# Patient Record
Sex: Female | Born: 1955 | Race: Black or African American | Hispanic: No | Marital: Single | Smoking: Current every day smoker
Health system: Southern US, Community
[De-identification: ages and names within clinical notes are randomized; demographics above are authoritative.]

## PROBLEM LIST (undated history)

## (undated) DIAGNOSIS — C801 Malignant (primary) neoplasm, unspecified: Secondary | ICD-10-CM

## (undated) DIAGNOSIS — K56609 Unspecified intestinal obstruction, unspecified as to partial versus complete obstruction: Secondary | ICD-10-CM

## (undated) DIAGNOSIS — I639 Cerebral infarction, unspecified: Secondary | ICD-10-CM

## (undated) DIAGNOSIS — J219 Acute bronchiolitis, unspecified: Secondary | ICD-10-CM

## (undated) DIAGNOSIS — E119 Type 2 diabetes mellitus without complications: Secondary | ICD-10-CM

## (undated) DIAGNOSIS — K760 Fatty (change of) liver, not elsewhere classified: Secondary | ICD-10-CM

## (undated) HISTORY — PX: COLON SURGERY: SHX602

## (undated) HISTORY — PX: ABDOMINAL HYSTERECTOMY: SHX81

---

## 2017-07-22 ENCOUNTER — Encounter (HOSPITAL_COMMUNITY): Payer: Self-pay

## 2017-07-22 DIAGNOSIS — K56600 Partial intestinal obstruction, unspecified as to cause: Principal | ICD-10-CM | POA: Insufficient documentation

## 2017-07-22 DIAGNOSIS — Z923 Personal history of irradiation: Secondary | ICD-10-CM | POA: Diagnosis not present

## 2017-07-22 DIAGNOSIS — K435 Parastomal hernia without obstruction or  gangrene: Secondary | ICD-10-CM | POA: Diagnosis not present

## 2017-07-22 DIAGNOSIS — Z9221 Personal history of antineoplastic chemotherapy: Secondary | ICD-10-CM | POA: Diagnosis not present

## 2017-07-22 DIAGNOSIS — K56609 Unspecified intestinal obstruction, unspecified as to partial versus complete obstruction: Secondary | ICD-10-CM | POA: Diagnosis present

## 2017-07-22 DIAGNOSIS — Z7982 Long term (current) use of aspirin: Secondary | ICD-10-CM | POA: Diagnosis not present

## 2017-07-22 DIAGNOSIS — Z794 Long term (current) use of insulin: Secondary | ICD-10-CM | POA: Diagnosis not present

## 2017-07-22 DIAGNOSIS — Z9049 Acquired absence of other specified parts of digestive tract: Secondary | ICD-10-CM | POA: Diagnosis not present

## 2017-07-22 DIAGNOSIS — Z85038 Personal history of other malignant neoplasm of large intestine: Secondary | ICD-10-CM | POA: Diagnosis not present

## 2017-07-22 DIAGNOSIS — G8929 Other chronic pain: Secondary | ICD-10-CM | POA: Diagnosis not present

## 2017-07-22 DIAGNOSIS — Z933 Colostomy status: Secondary | ICD-10-CM | POA: Diagnosis not present

## 2017-07-22 DIAGNOSIS — F17210 Nicotine dependence, cigarettes, uncomplicated: Secondary | ICD-10-CM | POA: Diagnosis not present

## 2017-07-22 DIAGNOSIS — R109 Unspecified abdominal pain: Secondary | ICD-10-CM | POA: Diagnosis not present

## 2017-07-22 DIAGNOSIS — Z8673 Personal history of transient ischemic attack (TIA), and cerebral infarction without residual deficits: Secondary | ICD-10-CM | POA: Insufficient documentation

## 2017-07-22 DIAGNOSIS — J219 Acute bronchiolitis, unspecified: Secondary | ICD-10-CM | POA: Diagnosis not present

## 2017-07-22 DIAGNOSIS — K760 Fatty (change of) liver, not elsewhere classified: Secondary | ICD-10-CM | POA: Insufficient documentation

## 2017-07-22 DIAGNOSIS — Z79899 Other long term (current) drug therapy: Secondary | ICD-10-CM | POA: Diagnosis not present

## 2017-07-22 DIAGNOSIS — I10 Essential (primary) hypertension: Secondary | ICD-10-CM | POA: Insufficient documentation

## 2017-07-22 DIAGNOSIS — E119 Type 2 diabetes mellitus without complications: Secondary | ICD-10-CM | POA: Diagnosis not present

## 2017-07-22 LAB — COMPREHENSIVE METABOLIC PANEL
ALBUMIN: 3.2 g/dL — AB (ref 3.5–5.0)
ALK PHOS: 125 U/L (ref 38–126)
ALT: 9 U/L — AB (ref 14–54)
AST: 17 U/L (ref 15–41)
Anion gap: 7 (ref 5–15)
BUN: 7 mg/dL (ref 6–20)
CALCIUM: 8.8 mg/dL — AB (ref 8.9–10.3)
CHLORIDE: 106 mmol/L (ref 101–111)
CO2: 25 mmol/L (ref 22–32)
CREATININE: 0.64 mg/dL (ref 0.44–1.00)
GFR calc Af Amer: >60 mL/min (ref >60)
GFR calc non Af Amer: >60 mL/min (ref >60)
GLUCOSE: 238 mg/dL — AB (ref 65–99)
Potassium: 3.9 mmol/L (ref 3.5–5.1)
SODIUM: 138 mmol/L (ref 135–145)
Total Bilirubin: 0.4 mg/dL (ref 0.3–1.2)
Total Protein: 6.8 g/dL (ref 6.5–8.1)

## 2017-07-22 LAB — URINALYSIS, ROUTINE W REFLEX MICROSCOPIC
Bacteria, UA: NONE SEEN
Bilirubin Urine: NEGATIVE
GLUCOSE, UA: NEGATIVE mg/dL
Hgb urine dipstick: NEGATIVE
KETONES UR: NEGATIVE mg/dL
Leukocytes, UA: NEGATIVE
Nitrite: NEGATIVE
PH: 5 (ref 5.0–8.0)
PROTEIN: 30 mg/dL — AB
SQUAMOUS EPITHELIAL / LPF: NONE SEEN
Specific Gravity, Urine: 1.023 (ref 1.005–1.030)

## 2017-07-22 LAB — CBC
HCT: 36.3 % (ref 36.0–46.0)
Hemoglobin: 11.3 g/dL — ABNORMAL LOW (ref 12.0–15.0)
MCH: 27 pg (ref 26.0–34.0)
MCHC: 31.1 g/dL (ref 30.0–36.0)
MCV: 86.8 fL (ref 78.0–100.0)
PLATELETS: 279 10*3/uL (ref 150–400)
RBC: 4.18 MIL/uL (ref 3.87–5.11)
RDW: 16.4 % — ABNORMAL HIGH (ref 11.5–15.5)
WBC: 8.3 10*3/uL (ref 4.0–10.5)

## 2017-07-22 LAB — LIPASE, BLOOD: LIPASE: 24 U/L (ref 11–51)

## 2017-07-22 NOTE — ED Triage Notes (Signed)
Pt here for abd cramping, hx of colon cancer and reports pain is typical of her cancer pain, denies vomiting or diarrhea but does have nausea. Pt reports she is in remission

## 2017-07-23 ENCOUNTER — Inpatient Hospital Stay (HOSPITAL_COMMUNITY): Payer: Medicaid Other

## 2017-07-23 ENCOUNTER — Emergency Department (HOSPITAL_COMMUNITY): Payer: Medicaid Other

## 2017-07-23 ENCOUNTER — Encounter (HOSPITAL_COMMUNITY): Payer: Self-pay | Admitting: Internal Medicine

## 2017-07-23 ENCOUNTER — Observation Stay (HOSPITAL_COMMUNITY)
Admission: EM | Admit: 2017-07-23 | Discharge: 2017-07-24 | Disposition: A | Discharge status: Home / Self Care | Payer: Medicaid Other | Attending: Internal Medicine | Admitting: Internal Medicine

## 2017-07-23 DIAGNOSIS — Z0189 Encounter for other specified special examinations: Secondary | ICD-10-CM

## 2017-07-23 DIAGNOSIS — K56609 Unspecified intestinal obstruction, unspecified as to partial versus complete obstruction: Secondary | ICD-10-CM | POA: Diagnosis not present

## 2017-07-23 DIAGNOSIS — K760 Fatty (change of) liver, not elsewhere classified: Secondary | ICD-10-CM

## 2017-07-23 DIAGNOSIS — C801 Malignant (primary) neoplasm, unspecified: Secondary | ICD-10-CM | POA: Diagnosis not present

## 2017-07-23 DIAGNOSIS — K56600 Partial intestinal obstruction, unspecified as to cause: Secondary | ICD-10-CM | POA: Diagnosis present

## 2017-07-23 DIAGNOSIS — I10 Essential (primary) hypertension: Secondary | ICD-10-CM | POA: Diagnosis not present

## 2017-07-23 DIAGNOSIS — G8929 Other chronic pain: Secondary | ICD-10-CM | POA: Diagnosis not present

## 2017-07-23 DIAGNOSIS — J219 Acute bronchiolitis, unspecified: Secondary | ICD-10-CM | POA: Diagnosis not present

## 2017-07-23 DIAGNOSIS — I639 Cerebral infarction, unspecified: Secondary | ICD-10-CM

## 2017-07-23 DIAGNOSIS — E119 Type 2 diabetes mellitus without complications: Secondary | ICD-10-CM | POA: Diagnosis not present

## 2017-07-23 DIAGNOSIS — R109 Unspecified abdominal pain: Secondary | ICD-10-CM | POA: Diagnosis not present

## 2017-07-23 DIAGNOSIS — Z433 Encounter for attention to colostomy: Secondary | ICD-10-CM

## 2017-07-23 HISTORY — DX: Unspecified intestinal obstruction, unspecified as to partial versus complete obstruction: K56.609

## 2017-07-23 HISTORY — DX: Type 2 diabetes mellitus without complications: E11.9

## 2017-07-23 HISTORY — DX: Cerebral infarction, unspecified: I63.9

## 2017-07-23 HISTORY — DX: Malignant (primary) neoplasm, unspecified: C80.1

## 2017-07-23 HISTORY — DX: Fatty (change of) liver, not elsewhere classified: K76.0

## 2017-07-23 HISTORY — DX: Acute bronchiolitis, unspecified: J21.9

## 2017-07-23 LAB — TYPE AND SCREEN
ABO/RH(D): B POS
Antibody Screen: NEGATIVE

## 2017-07-23 LAB — HIV ANTIBODY (ROUTINE TESTING W REFLEX): HIV Screen 4th Generation wRfx: NONREACTIVE

## 2017-07-23 LAB — GLUCOSE, CAPILLARY
GLUCOSE-CAPILLARY: 202 mg/dL — AB (ref 65–99)
GLUCOSE-CAPILLARY: 115 mg/dL — AB (ref 65–99)
Glucose-Capillary: 137 mg/dL — ABNORMAL HIGH (ref 65–99)
Glucose-Capillary: 151 mg/dL — ABNORMAL HIGH (ref 65–99)

## 2017-07-23 LAB — CBG MONITORING, ED: Glucose-Capillary: 203 mg/dL — ABNORMAL HIGH (ref 65–99)

## 2017-07-23 LAB — ABO/RH: ABO/RH(D): B POS

## 2017-07-23 LAB — PROTIME-INR
INR: 1.07
PROTHROMBIN TIME: 13.9 s (ref 11.4–15.2)

## 2017-07-23 MED ORDER — SODIUM CHLORIDE 0.9 % IV SOLN
INTRAVENOUS | Status: AC
  Administered 2017-07-23: 07:00:00 via INTRAVENOUS

## 2017-07-23 MED ORDER — ACETAMINOPHEN 325 MG PO TABS: 650.0000 mg | ORAL_TABLET | Freq: Four times a day (QID) | ORAL | Status: DC | PRN

## 2017-07-23 MED ORDER — INSULIN ASPART 100 UNIT/ML ~~LOC~~ SOLN
0.0000 [IU] | SUBCUTANEOUS | Status: DC
  Administered 2017-07-23 (×2): 5 [IU] via SUBCUTANEOUS
  Administered 2017-07-23 – 2017-07-24 (×2): 3 [IU] via SUBCUTANEOUS
  Administered 2017-07-24: 2 [IU] via SUBCUTANEOUS
  Administered 2017-07-24: 5 [IU] via SUBCUTANEOUS
  Administered 2017-07-24: 3 [IU] via SUBCUTANEOUS
  Filled 2017-07-23: qty 1

## 2017-07-23 MED ORDER — ONDANSETRON HCL 4 MG/2ML IJ SOLN: 4.0000 mg | Freq: Three times a day (TID) | INTRAMUSCULAR | Status: DC | PRN

## 2017-07-23 MED ORDER — HYDROMORPHONE HCL 1 MG/ML IJ SOLN
1.0000 mg | Freq: Once | INTRAMUSCULAR | Status: AC
  Administered 2017-07-23: 1 mg via INTRAVENOUS
  Filled 2017-07-23: qty 1

## 2017-07-23 MED ORDER — INSULIN ASPART 100 UNIT/ML ~~LOC~~ SOLN: 0.0000 [IU] | Freq: Three times a day (TID) | SUBCUTANEOUS | Status: DC

## 2017-07-23 MED ORDER — ONDANSETRON HCL 4 MG/2ML IJ SOLN: 4.0000 mg | Freq: Four times a day (QID) | INTRAMUSCULAR | Status: DC | PRN

## 2017-07-23 MED ORDER — ALBUTEROL SULFATE (2.5 MG/3ML) 0.083% IN NEBU: 3.0000 mL | INHALATION_SOLUTION | Freq: Four times a day (QID) | RESPIRATORY_TRACT | Status: DC | PRN

## 2017-07-23 MED ORDER — IOPAMIDOL (ISOVUE-300) INJECTION 61%
INTRAVENOUS | Status: AC
  Filled 2017-07-23: qty 30

## 2017-07-23 MED ORDER — ONDANSETRON HCL 4 MG PO TABS: 4.0000 mg | ORAL_TABLET | Freq: Four times a day (QID) | ORAL | Status: DC | PRN

## 2017-07-23 MED ORDER — INSULIN GLARGINE 100 UNIT/ML ~~LOC~~ SOLN
10.0000 [IU] | Freq: Every day | SUBCUTANEOUS | Status: DC
  Administered 2017-07-23 – 2017-07-24 (×2): 10 [IU] via SUBCUTANEOUS
  Filled 2017-07-23 (×2): qty 0.1

## 2017-07-23 MED ORDER — KETOROLAC TROMETHAMINE 15 MG/ML IJ SOLN
15.0000 mg | Freq: Four times a day (QID) | INTRAMUSCULAR | Status: DC | PRN
  Administered 2017-07-23: 15 mg via INTRAVENOUS
  Filled 2017-07-23: qty 1

## 2017-07-23 MED ORDER — HYDRALAZINE HCL 20 MG/ML IJ SOLN: 5.0000 mg | INTRAMUSCULAR | Status: DC | PRN

## 2017-07-23 MED ORDER — ACETAMINOPHEN 650 MG RE SUPP: 650.0000 mg | Freq: Four times a day (QID) | RECTAL | Status: DC | PRN

## 2017-07-23 MED ORDER — FENTANYL CITRATE (PF) 100 MCG/2ML IJ SOLN
50.0000 ug | Freq: Once | INTRAMUSCULAR | Status: AC
  Administered 2017-07-23: 50 ug via INTRAVENOUS
  Filled 2017-07-23: qty 2

## 2017-07-23 MED ORDER — DIATRIZOATE MEGLUMINE & SODIUM 66-10 % PO SOLN
90.0000 mL | Freq: Once | ORAL | Status: AC
  Administered 2017-07-23: 90 mL via NASOGASTRIC
  Filled 2017-07-23 (×2): qty 90

## 2017-07-23 MED ORDER — MORPHINE SULFATE (PF) 4 MG/ML IV SOLN
2.0000 mg | INTRAVENOUS | Status: DC | PRN
  Administered 2017-07-23: 2 mg via INTRAVENOUS
  Filled 2017-07-23: qty 1

## 2017-07-23 MED ORDER — HYDROMORPHONE HCL 1 MG/ML IJ SOLN
1.0000 mg | INTRAMUSCULAR | Status: DC | PRN
  Administered 2017-07-23 – 2017-07-24 (×7): 1 mg via INTRAVENOUS
  Filled 2017-07-23 (×8): qty 1

## 2017-07-23 NOTE — ED Notes (Signed)
CBG 203 

## 2017-07-23 NOTE — ED Notes (Signed)
Pt is refusing to get into a gown.

## 2017-07-23 NOTE — ED Notes (Addendum)
Pt is now agreeing to have a CT scan done with oral contrast.

## 2017-07-23 NOTE — Consult Note (Signed)
Reason for Consult: SBO CC:  Abdominal pain Referring Physician: [name] is an 61 y.o. female.  HPI: Pt presented to the ED early this Am with abdominal cramps becoming progressively worse.  This is a chronic issue for her. She had a similar episode in June of this year, in [place], this resolved on its own. She got no relief with Tylenol today. She reports she is undergoing radiation and chemotherapy. Her last radiation therapy was one month ago. Chemotherapy was sometime this year but she cannot remember the last treatment. She is followed by pain management, and has not been seen recently. She reports she was on oxycodone 10 mg 4 times a day. The pain clinic is closed and she has been without medications or refills for 3 weeks. She ran out approximately 2 weeks ago.  Work up in the ED shows she is afebrile, VSS.  Glucose is 238 and albumin is low, labs are otherwise unremarkable.  WBC 8.3.  UA is negative.  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  Mildly dilated loops of SB with fecalization and mild edema with, possible SBO. We are ask to see.    Past Medical History:  Diagnosis Date  . Bronchiolitis   . Cancer ([place])   . Diabetes mellitus without complication ([place])   . Hepatic steatosis   . SBO (small bowel obstruction) ([facility]) Hx of CVA 2018 while on Chemotherapy, not sure of date Chronic pain - on Oxycodone till about 2 weeks ago     Past Surgical History:  Procedure Laterality Date  . COLON SURGERY  2016    Family History  Problem Relation Age of Onset  . Diabetes Mother   . Hypertension Father     Social History:  reports that she does not drink alcohol or use drugs. Her tobacco history is not on file. Separated Tobacco: Less than 1 pack per day for 42 years. EtOH: Social Drugs: None for many years.  Allergies:  No Known Allergies  Prior to Admission medications   Medication Sig Start Date End Date Taking? Authorizing Provider  albuterol (PROVENTIL HFA;VENTOLIN HFA) 108 (90 Base) MCG/ACT inhaler Inhale 1-2 puffs into the lungs every 6 (six) hours as needed for wheezing or shortness of breath.   Yes [provider]  aspirin EC 81 MG tablet Take 81 mg by mouth daily.   Yes [provider]  Cholecalciferol (VITAMIN D3) 5000 units TABS Take 5,000 Units by mouth daily.   Yes [provider]  gabapentin (NEURONTIN) 300 MG capsule Take 600 mg by mouth 3 (three) times daily.   Yes [provider]  insulin glargine (LANTUS) 100 UNIT/ML injection Inject 10 Units into the skin daily.   Yes [provider]  lisinopril (PRINIVIL,ZESTRIL) 10 MG tablet Take 10 mg by mouth daily.   Yes [provider]  metFORMIN (GLUCOPHAGE) 1000 MG tablet  Oxycodone 10 mg 4 times a day    Take 1,000 mg by mouth daily with breakfast.   Yes [provider]    Patient reports none for 2 weeks.      Results for orders placed or performed during the hospital encounter of 07/23/17 (from the past 48 hour(s))  Lipase, blood     Status: None   Collection Time: 07/22/17  9:10 PM  Result Value Ref Range   Lipase 24 11 -  51 U/L  Comprehensive metabolic panel     Status: Abnormal   Collection Time: 07/22/17  9:10 PM  Result Value Ref Range   Sodium 138 135 - 145 mmol/L   Potassium 3.9 3.5 - 5.1 mmol/L   Chloride 106 101 - 111 mmol/L   CO2 25 22 - 32 mmol/L   Glucose, Bld 238 (H) 65 - 99 mg/dL   BUN 7 6 - 20 mg/dL   Creatinine, Ser 0.64 0.44 - 1.00 mg/dL   Calcium 8.8 (L) 8.9 - 10.3 mg/dL   Total Protein 6.8 6.5 - 8.1 g/dL   Albumin 3.2 (L) 3.5 - 5.0 g/dL   AST 17 15 - 41 U/L   ALT 9 (L) 14 - 54 U/L   Alkaline Phosphatase 125 38 - 126 U/L   Total Bilirubin 0.4 0.3 - 1.2 mg/dL   GFR calc non Af Amer >60 >60 mL/min   GFR calc Af Amer >60 >60 mL/min    Comment:  (NOTE) The eGFR has been calculated using the CKD EPI equation. This calculation has not been validated in all clinical situations. eGFR's persistently <60 mL/min signify possible Chronic Kidney Disease.    Anion gap 7 5 - 15  CBC     Status: Abnormal   Collection Time: 07/22/17  9:10 PM  Result Value Ref Range   WBC 8.3 4.0 - 10.5 K/uL   RBC 4.18 3.87 - 5.11 MIL/uL   Hemoglobin 11.3 (L) 12.0 - 15.0 g/dL   HCT 36.3 36.0 - 46.0 %   MCV 86.8 78.0 - 100.0 fL   MCH 27.0 26.0 - 34.0 pg   MCHC 31.1 30.0 - 36.0 g/dL   RDW 16.4 (H) 11.5 - 15.5 %   Platelets 279 150 - 400 K/uL  Urinalysis, Routine w reflex microscopic     Status: Abnormal   Collection Time: 07/22/17  9:13 PM  Result Value Ref Range   Color, Urine YELLOW YELLOW   APPearance CLEAR CLEAR   Specific Gravity, Urine 1.023 1.005 - 1.030   pH 5.0 5.0 - 8.0   Glucose, UA NEGATIVE NEGATIVE mg/dL   Hgb urine dipstick NEGATIVE NEGATIVE   Bilirubin Urine NEGATIVE NEGATIVE   Ketones, ur NEGATIVE NEGATIVE mg/dL   Protein, ur 30 (A) NEGATIVE mg/dL   Nitrite NEGATIVE NEGATIVE   Leukocytes, UA NEGATIVE NEGATIVE   RBC / HPF 0-5 0 - 5 RBC/hpf   WBC, UA 0-5 0 - 5 WBC/hpf   Bacteria, UA NONE SEEN NONE SEEN   Squamous Epithelial / LPF NONE SEEN NONE SEEN   Mucous PRESENT   Protime-INR     Status: None   Collection Time: 07/23/17  7:12 AM  Result Value Ref Range   Prothrombin Time 13.9 11.4 - 15.2 seconds   INR 1.07   Type and screen Katherine Gonzalez     Status: None (Preliminary result)   Collection Time: 07/23/17  7:21 AM  Result Value Ref Range   ABO/RH(Katherine) B POS    Antibody Screen PENDING    Sample Expiration 07/26/2017   CBG monitoring, ED     Status: Abnormal   Collection Time: 07/23/17  7:52 AM  Result Value Ref Range   Glucose-Capillary 203 (H) 65 - 99 mg/dL    Ct Abdomen Pelvis Wo Contrast  Result Date: 07/23/2017 CLINICAL DATA:  62 y/o  F; upper abdominal pain. EXAM: CT ABDOMEN AND PELVIS WITHOUT CONTRAST  TECHNIQUE: Multidetector CT imaging of the abdomen and pelvis was performed following  the standard protocol without IV contrast. COMPARISON:  None. FINDINGS: Lower chest: Small cluster of nodules in left lower lobe with bronchovascular distribution compatible with minimal bronchiolitis. Hepatobiliary: Hepatic steatosis. Punctate calcification in right lobe of liver. Normal gallbladder. No intra or extrahepatic biliary ductal dilatation. Pancreas: Unremarkable. No pancreatic ductal dilatation or surrounding inflammatory changes. Spleen: Normal in size without focal abnormality. Adrenals/Urinary Tract: Normal adrenal glands. 1 cm lucency within left kidney interpolar region with fluid attenuation compatible with cysts. No urinary stone disease. No hydronephrosis. Normal bladder. Stomach/Bowel: Partial colectomy. Left lower quadrant colostomy. 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 Mildly dilated loops of small bowel within the lower abdomen with fecalization and mild surrounding edema may represent small-bowel obstruction, no discrete transition identified. Vascular/Lymphatic: Aortic atherosclerosis. No enlarged abdominal or pelvic lymph nodes. Reproductive: Status post hysterectomy. No adnexal masses. Other: Negative. Musculoskeletal: No acute osseous abnormality. Mild degenerative changes of the spine greatest at the L5-S1 level where there is disc space narrowing. IMPRESSION: 1. Mildly dilated loops of small bowel in the lower abdomen with fecalization and mild surrounding edema may represent small bowel obstruction, no discrete transition identified. 2. 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 3. Hepatic steatosis. 4. Minimal left lower lobe bronchiolitis. Electronically Signed   By: Kristine Garbe M.Katherine.   On: 07/23/2017 05:15   Dg Abd Portable 1  View  Result Date: 07/23/2017 CLINICAL DATA:  61 y/o  F; enteric tube placement. EXAM: PORTABLE ABDOMEN - 1 VIEW COMPARISON:  07/23/2017 CT abdomen and pelvis. FINDINGS: Enteric tube tip projects over the gastric body. Normal partially visualized bowel gas pattern. No acute osseous abnormality identified. IMPRESSION: Enteric tube tip projects over gastric body. Electronically Signed   By: Kristine Garbe M.Katherine.   On: 07/23/2017 06:57    Review of Systems  Constitutional: Negative.   HENT: Negative.   Eyes: Negative.   Respiratory: Positive for cough and shortness of breath. Negative for hemoptysis, sputum production and wheezing.        She says she is chronically SOB  Cardiovascular: Negative.   Gastrointestinal: Positive for abdominal pain (sounds like she has cramping, and this is not the usual SBO like discomfort), nausea and vomiting (x 1 this AM). Negative for blood in stool, constipation, diarrhea, heartburn and melena.  Genitourinary: Negative.   Musculoskeletal: Negative.   Skin: Negative.   Neurological: Negative.   Endo/Heme/Allergies: Negative.   Psychiatric/Behavioral: Negative.    Blood pressure 128/66, pulse 78, temperature 98.7 F (37.1 C), resp. rate (!) 21, SpO2 97 %. Physical Exam  Constitutional: She is oriented to person, place, and time. She appears well-developed and well-nourished. She appears distressed.  Pt complaining of cramps and after some time in the room pain gets significantly worse, not typical on SBO like pain.    HENT:  Head: Normocephalic and atraumatic.  Mouth/Throat: No oropharyngeal exudate.  Eyes: Right eye exhibits no discharge. Left eye exhibits no discharge. No scleral icterus.  Pupils are equal  Neck: Normal range of motion. Neck supple. No JVD present. No tracheal deviation present. No thyromegaly present.  Cardiovascular: Normal rate, regular rhythm, normal heart sounds and intact distal pulses.   No murmur heard. Respiratory:  Effort normal and breath sounds normal. No respiratory distress. She has no wheezes. She has no rales. She exhibits no tenderness.  Right chest port  GI: Soft. She exhibits distension (minimal  distension). She exhibits no mass. There is tenderness (she was comfortable when i came in but got cramps and is rocking back and forth in bed toward end of exam.). There is no rebound and no guarding.  Few BS Colostomy in LLQ with stool in it.  Pt reports this is from yesterday  Musculoskeletal: She exhibits no edema or tenderness.  Lymphadenopathy:    She has no cervical adenopathy.  Neurological: She is alert and oriented to person, place, and time. No cranial nerve deficit.  Skin: Skin is warm and dry. No rash noted. She is not diaphoretic. No erythema. No pallor.  Psychiatric: She has a normal mood and affect. Her behavior is normal. Judgment and thought content normal.    Assessment/Plan: Colon cancer with partial colectomy, radiation and chemotherapy this year. Partial small bowel obstruction. Chronic abdominal pain on oxycodone - patient has been out of medications for 2 weeks. 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 Bowel rest and IV hydration. We will follow with you.    Anjelika Ausburn 07/23/2017, 8:15 AM

## 2017-07-23 NOTE — ED Provider Notes (Signed)
[facility] DEPT Provider Note   CSN: [id number] Arrival date & time: 07/22/17  2100  By signing my name below, I, Mayer Masker, attest that this documentation has been prepared under the direction and in the presence of Crystalyn Delia, MD  Electronically Signed: Mayer Masker, Scribe. 07/23/17. 1:40 AM.  History   Chief Complaint Chief Complaint  Patient presents with  . Abdominal Cramping   The history is provided by the patient. No language interpreter was used.  Abdominal Pain   This is a chronic problem. The current episode started more than 1 week ago. The problem has not changed since onset.The pain is associated with an unknown factor. Pertinent negatives include fever, diarrhea and constipation. Nothing aggravates the symptoms. Nothing relieves the symptoms. Her past medical history does not include PUD.    HPI Comments: Katherine Gonzalez is a 61 y.o. female with PMHx of colon cancer and who presents to the Emergency Department complaining of constant, gradually worsening abdominal pain. This is a chronic issue for her. She has tried taking tylenol with mild to no relief. Pt has an extensive PMHx of colon cancer with multiple rounds of radiation (her last 1 month ago) and chemotherapy (her last between 1-2 months ago). Pt states her symptoms feel similarly to her current pain. She states she is visiting from [place], [place] and usually visits a pain management doctor, but states this facility was closing down, so she has not been able to see anyone for pain management. She states she is only 3 weeks in remission. She has a colostomy bag in the left middle quadrant and had her colon removed in 2016. She usually goes to [facility] in [place], [place].  Past Medical History:  Diagnosis Date  . Cancer ([place])   . Diabetes mellitus without complication ([place])     There are no active problems to display for this patient.   Past Surgical History:  Procedure Laterality Date  . COLON  SURGERY      OB History    No data available       Home Medications    Prior to Admission medications   Not on File    Family History History reviewed. No pertinent family history.  Social History Social History  Substance Use Topics  . Smoking status: Not on file  . Smokeless tobacco: Not on file  . Alcohol use No     Allergies   Patient has no known allergies.   Review of Systems Review of Systems  Constitutional: Negative for appetite change, chills and fever.  HENT: Negative for drooling and facial swelling.   Eyes: Negative for photophobia.  Respiratory: Negative for shortness of breath.   Cardiovascular: Negative for chest pain, palpitations and leg swelling.  Gastrointestinal: Positive for abdominal pain. Negative for anal bleeding, constipation and diarrhea.  Genitourinary: Negative for difficulty urinating.  Musculoskeletal: Negative for neck stiffness.  Skin: Negative for pallor.  Neurological: Negative for facial asymmetry and speech difficulty.  Psychiatric/Behavioral: Negative for suicidal ideas.  All other systems reviewed and are negative.    Physical Exam Updated Vital Signs BP (!) 154/83   Pulse (!) 101   Temp 98.7 F (37.1 C)   Resp 18   SpO2 100%   Physical Exam  Constitutional: She is oriented to person, place, and time. She appears well-developed and well-nourished. No distress.  Pt is somnolent   HENT:  Head: Normocephalic and atraumatic.  Eyes: EOM are normal.  Pinpoint pupils  Neck: Neck supple.  Cardiovascular: Normal rate, regular rhythm, normal heart sounds and intact distal pulses.  Exam reveals no gallop and no friction rub.   No murmur heard. Intact distal pulses  Pulmonary/Chest: Effort normal and breath sounds normal. No respiratory distress. She has no wheezes. She has no rales.  Abdominal: Soft. Bowel sounds are normal. She exhibits no distension. There is no tenderness. There is no rebound and no guarding.  Good  bowel sounds throughout Pt has a colostomy bag in the left middle quadrant  Genitourinary: No vaginal discharge found.  Musculoskeletal: Normal range of motion.  Neurological: She is oriented to person, place, and time.  Skin: No rash noted. No erythema.  Psychiatric: She has a normal mood and affect.     ED Treatments / Results  DIAGNOSTIC STUDIES: Oxygen Saturation is 100% on RA, normal by my interpretation.    COORDINATION OF CARE: 1:38 AM Discussed treatment plan with pt at bedside and pt expressed unhappiness with receiving a CT scan due to nausea.  Labs (all labs ordered are listed, but only abnormal results are displayed)  Results for orders placed or performed during the hospital encounter of 07/23/17  Lipase, blood  Result Value Ref Range   Lipase 24 11 - 51 U/L  Comprehensive metabolic panel  Result Value Ref Range   Sodium 138 135 - 145 mmol/L   Potassium 3.9 3.5 - 5.1 mmol/L   Chloride 106 101 - 111 mmol/L   CO2 25 22 - 32 mmol/L   Glucose, Bld 238 (H) 65 - 99 mg/dL   BUN 7 6 - 20 mg/dL   Creatinine, Ser 0.64 0.44 - 1.00 mg/dL   Calcium 8.8 (L) 8.9 - 10.3 mg/dL   Total Protein 6.8 6.5 - 8.1 g/dL   Albumin 3.2 (L) 3.5 - 5.0 g/dL   AST 17 15 - 41 U/L   ALT 9 (L) 14 - 54 U/L   Alkaline Phosphatase 125 38 - 126 U/L   Total Bilirubin 0.4 0.3 - 1.2 mg/dL   GFR calc non Af Amer >60 >60 mL/min   GFR calc Af Amer >60 >60 mL/min   Anion gap 7 5 - 15  CBC  Result Value Ref Range   WBC 8.3 4.0 - 10.5 K/uL   RBC 4.18 3.87 - 5.11 MIL/uL   Hemoglobin 11.3 (L) 12.0 - 15.0 g/dL   HCT 36.3 36.0 - 46.0 %   MCV 86.8 78.0 - 100.0 fL   MCH 27.0 26.0 - 34.0 pg   MCHC 31.1 30.0 - 36.0 g/dL   RDW 16.4 (H) 11.5 - 15.5 %   Platelets 279 150 - 400 K/uL  Urinalysis, Routine w reflex microscopic  Result Value Ref Range   Color, Urine YELLOW YELLOW   APPearance CLEAR CLEAR   Specific Gravity, Urine 1.023 1.005 - 1.030   pH 5.0 5.0 - 8.0   Glucose, UA NEGATIVE NEGATIVE mg/dL     Hgb urine dipstick NEGATIVE NEGATIVE   Bilirubin Urine NEGATIVE NEGATIVE   Ketones, ur NEGATIVE NEGATIVE mg/dL   Protein, ur 30 (A) NEGATIVE mg/dL   Nitrite NEGATIVE NEGATIVE   Leukocytes, UA NEGATIVE NEGATIVE   RBC / HPF 0-5 0 - 5 RBC/hpf   WBC, UA 0-5 0 - 5 WBC/hpf   Bacteria, UA NONE SEEN NONE SEEN   Squamous Epithelial / LPF NONE SEEN NONE SEEN   Mucous PRESENT   Protime-INR  Result Value Ref Range   Prothrombin Time 13.9 11.4 - 15.2 seconds   INR 1.07  HIV antibody (Routine Testing)  Result Value Ref Range   HIV Screen 4th Generation wRfx Non Reactive Non Reactive  Hemoglobin A1c  Result Value Ref Range   Hgb A1c MFr Bld 7.7 (H) 4.8 - 5.6 %   Mean Plasma Glucose 174 mg/dL  Glucose, capillary  Result Value Ref Range   Glucose-Capillary 202 (H) 65 - 99 mg/dL   Comment 1 Notify RN   Glucose, capillary  Result Value Ref Range   Glucose-Capillary 151 (H) 65 - 99 mg/dL  Basic metabolic panel  Result Value Ref Range   Sodium 141 135 - 145 mmol/L   Potassium 3.7 3.5 - 5.1 mmol/L   Chloride 107 101 - 111 mmol/L   CO2 27 22 - 32 mmol/L   Glucose, Bld 163 (H) 65 - 99 mg/dL   BUN 7 6 - 20 mg/dL   Creatinine, Ser 0.61 0.44 - 1.00 mg/dL   Calcium 8.6 (L) 8.9 - 10.3 mg/dL   GFR calc non Af Amer >60 >60 mL/min   GFR calc Af Amer >60 >60 mL/min   Anion gap 7 5 - 15  CBC  Result Value Ref Range   WBC 7.2 4.0 - 10.5 K/uL   RBC 3.92 3.87 - 5.11 MIL/uL   Hemoglobin 10.3 (L) 12.0 - 15.0 g/dL   HCT 34.1 (L) 36.0 - 46.0 %   MCV 87.0 78.0 - 100.0 fL   MCH 26.3 26.0 - 34.0 pg   MCHC 30.2 30.0 - 36.0 g/dL   RDW 16.2 (H) 11.5 - 15.5 %   Platelets 272 150 - 400 K/uL  Protime-INR  Result Value Ref Range   Prothrombin Time 14.5 11.4 - 15.2 seconds   INR 1.12   Glucose, capillary  Result Value Ref Range   Glucose-Capillary 115 (H) 65 - 99 mg/dL  Glucose, capillary  Result Value Ref Range   Glucose-Capillary 137 (H) 65 - 99 mg/dL  Glucose, capillary  Result Value Ref Range    Glucose-Capillary 158 (H) 65 - 99 mg/dL  Glucose, capillary  Result Value Ref Range   Glucose-Capillary 153 (H) 65 - 99 mg/dL   Comment 1 Notify RN   Glucose, capillary  Result Value Ref Range   Glucose-Capillary 204 (H) 65 - 99 mg/dL  Glucose, capillary  Result Value Ref Range   Glucose-Capillary 157 (H) 65 - 99 mg/dL  CBG monitoring, ED  Result Value Ref Range   Glucose-Capillary 203 (H) 65 - 99 mg/dL  Type and screen [place]  Result Value Ref Range   ABO/RH(D) B POS    Antibody Screen NEG    Sample Expiration 07/26/2017   ABO/Rh  Result Value Ref Range   ABO/RH(D) B POS    Ct Abdomen Pelvis Wo Contrast  Result Date: 07/23/2017 CLINICAL DATA:  61 y/o  F; upper abdominal pain. EXAM: CT ABDOMEN AND PELVIS WITHOUT CONTRAST TECHNIQUE: Multidetector CT imaging of the abdomen and pelvis was performed following the standard protocol without IV contrast. COMPARISON:  None. FINDINGS: Lower chest: Small cluster of nodules in left lower lobe with bronchovascular distribution compatible with minimal bronchiolitis. Hepatobiliary: Hepatic steatosis. Punctate calcification in right lobe of liver. Normal gallbladder. No intra or extrahepatic biliary ductal dilatation. Pancreas: Unremarkable. No pancreatic ductal dilatation or surrounding inflammatory changes. Spleen: Normal in size without focal abnormality. Adrenals/Urinary Tract: Normal adrenal glands. 1 cm lucency within left kidney interpolar region with fluid attenuation compatible with cysts. No urinary stone disease. No hydronephrosis. Normal bladder. Stomach/Bowel: Partial colectomy.  Left lower quadrant colostomy. 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 Mildly dilated loops of small bowel within the lower abdomen with fecalization and mild surrounding edema may represent small-bowel obstruction, no discrete transition identified. Vascular/Lymphatic: Aortic atherosclerosis. No  enlarged abdominal or pelvic lymph nodes. Reproductive: Status post hysterectomy. No adnexal masses. Other: Negative. Musculoskeletal: No acute osseous abnormality. Mild degenerative changes of the spine greatest at the L5-S1 level where there is disc space narrowing. IMPRESSION: 1. Mildly dilated loops of small bowel in the lower abdomen with fecalization and mild surrounding edema may represent small bowel obstruction, no discrete transition identified. 2. 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 3. Hepatic steatosis. 4. Minimal left lower lobe bronchiolitis. Electronically Signed   By: Kristine Garbe M.D.   On: 07/23/2017 05:15   Dg Abd 1 View  Result Date: 07/24/2017 CLINICAL DATA:  Small-bowel protocol.  12 hour delay. EXAM: ABDOMEN - 1 VIEW COMPARISON:  07/23/2017 FINDINGS: Contrast material again demonstrated throughout the colon with no significant residual in the small bowel. No evidence of small bowel obstruction. Enteric tube tip is in the left upper quadrant consistent with location in the body of the stomach. IMPRESSION: Contrast material throughout the nondistended colon. No evidence of small bowel obstruction. Electronically Signed   By: Lucienne Capers M.D.   On: 07/24/2017 01:36   Dg Abd Portable 1v-small Bowel Obstruction Protocol-initial, 8 Hr Delay  Result Date: 07/23/2017 CLINICAL DATA:  Small bowel obstruction. EXAM: PORTABLE ABDOMEN - 1 VIEW COMPARISON:  07/23/2017 FINDINGS: The bowel gas pattern is normal. Oral contrast throughout the colon. No suspicious calcifications or organomegaly. Enteric catheter overlies expected location of gastric body. The stomach is decompressed. IMPRESSION: Nonobstructive bowel gas pattern. Electronically Signed   By: Fidela Salisbury M.D.   On: 07/23/2017 19:39   Dg Abd Portable 1 View  Result Date: 07/23/2017 CLINICAL DATA:  61  y/o  F; enteric tube placement. EXAM: PORTABLE ABDOMEN - 1 VIEW COMPARISON:  07/23/2017 CT abdomen and pelvis. FINDINGS: Enteric tube tip projects over the gastric body. Normal partially visualized bowel gas pattern. No acute osseous abnormality identified. IMPRESSION: Enteric tube tip projects over gastric body. Electronically Signed   By: Kristine Garbe M.D.   On: 07/23/2017 06:57    Procedures Procedures (including critical care time)  Medications Ordered in ED  Medications  iopamidol (ISOVUE-300) 61 % injection (not administered)  0.9 %  sodium chloride infusion ( Intravenous Rate/Dose Verify 07/23/17 2200)  0.9 %  sodium chloride infusion ( Intravenous Rate/Dose Verify 07/24/17 0600)  fentaNYL (SUBLIMAZE) injection 50 mcg (50 mcg Intravenous Given 07/23/17 0306)  HYDROmorphone (DILAUDID) injection 1 mg (1 mg Intravenous Given 07/23/17 0616)  diatrizoate meglumine-sodium (GASTROGRAFIN) 66-10 % solution 90 mL (90 mLs Per NG tube Given 07/23/17 1115)       Final Clinical Impressions(s) / ED Diagnoses  Strict return precautions for pain, fever, intractable vomiting or any concerns.    I personally performed the services described in this documentation, which was scribed in my presence. The recorded information has been reviewed and is accurate.      Alaycia Eardley, MD 07/27/17 2311

## 2017-07-23 NOTE — Progress Notes (Signed)
This is a no charge note  Transfer from [facility] per Dr. Randal Buba  61 year old lady with a past medical history of colon cancer, s/p of colostomy, hypertension, diabetes mellitus, who presents with abdominal pain and abdominal distention. Patient was found to have small bowel obstruction by CT scan. WBC 8.3, lipase 24, negative urinalysis, electrolytes renal function okay, slightly tachycardia, temperature normal, oxygen section 96% on room air. Pt is admitted to med-surg bed as inpt. NG tube was ordered. Gen. surgeon, Dr. Hulen Skains was consulted.    Ivor Costa, MD  [facility] Pager [phone]  If 7PM-7AM, please contact night-coverage [email or web address] Password [facility] 07/23/2017, 6:15 AM

## 2017-07-23 NOTE — ED Notes (Addendum)
Pt is here for abd cramps. Pt states she gets these frequently as "flare-ups" from her colon cancer. Pt has a colostomy. Pt states she is from out of town and does not have any pain medicine with her. Pt states she took Immodium so that she did not have any excessive colostomy movements. Upon exam pt has pinpoint pupils.

## 2017-07-23 NOTE — H&P (Signed)
History and Physical    Katherine Gonzalez JME:[id number] DOB: 04/07/1956 DOA: 07/23/2017  PCP: Patient, No Pcp Per Patient coming from: home in [place] (she is visiting family)  Chief Complaint: abdominal pain  HPI: Katherine Gonzalez is a very pleasant 61 y.o. female with medical history significant for colon cancer s/p partial colectomy 2016, diabetes presents to the emergency Department chief complaint of persistent worsening abdominal pain. Initial evaluation includes a CT of the abdomen consistent with partial small bowel obstruction.  Information is obtained from the patient and the chart. She is a resident of [place] in the area visiting family. She has chronic abdominal pain for which she goes to a pain clinic. She states she's been unable to go to the pain clinic as it's closing down. Over the last 2-3 days abdominal pain has persisted and worsened. She describes the pain as constant ache mostly in the upper quadrants. She denies fever chills nausea vomiting. She states that drainage from her colostomy bag has been normal denies any bright red blood or melena. She also reports she took some Imodium prior to traveling as she did 1 any excess drainage from her colostomy. She states she took Tylenol without relief and hasn't had anything to eat for over 24 hours. She denies chest pain palpitations shortness of breath diaphoresis. She denies headache dizziness syncope or near-syncope. She denies dysuria hematuria frequency or urgency. She reports compliance with her diabetes regimen    ED Course: In the emergency department she's afebrile hemodynamically stable and not hypoxic. She receives IV fluids, analgesia and NG tube.   Review of Systems: As per HPI otherwise all other systems reviewed and are negative.   Ambulatory Status: Ambulates independently with a steady gait is independent with ADLs  Past Medical History:  Diagnosis Date  . Bronchiolitis   . Cancer ([place])   . Diabetes mellitus  without complication ([place])   . Hepatic steatosis   . SBO (small bowel obstruction) ([facility])     Past Surgical History:  Procedure Laterality Date  . COLON SURGERY      Social History   Social History  . Marital status: Single    Spouse name: N/A  . Number of children: N/A  . Years of education: N/A   Occupational History  . Not on file.   Social History Main Topics  . Smoking status: Not on file  . Smokeless tobacco: Not on file  . Alcohol use No  . Drug use: No  . Sexual activity: Not on file   Other Topics Concern  . Not on file   Social History Narrative  . No narrative on file    No Known Allergies  Family History  Problem Relation Age of Onset  . Diabetes Mother   . Hypertension Father     Prior to Admission medications   Medication Sig Start Date End Date Taking? Authorizing Provider  albuterol (PROVENTIL HFA;VENTOLIN HFA) 108 (90 Base) MCG/ACT inhaler Inhale 1-2 puffs into the lungs every 6 (six) hours as needed for wheezing or shortness of breath.   Yes [provider]  aspirin EC 81 MG tablet Take 81 mg by mouth daily.   Yes [provider]  Cholecalciferol (VITAMIN D3) 5000 units TABS Take 5,000 Units by mouth daily.   Yes [provider]  gabapentin (NEURONTIN) 300 MG capsule Take 600 mg by mouth 3 (three) times daily.   Yes [provider]  insulin glargine (LANTUS) 100 UNIT/ML injection Inject 10 Units into the  skin daily.   Yes [provider]  lisinopril (PRINIVIL,ZESTRIL) 10 MG tablet Take 10 mg by mouth daily.   Yes [provider]  metFORMIN (GLUCOPHAGE) 1000 MG tablet Take 1,000 mg by mouth daily with breakfast.   Yes [provider]    Physical Exam: Vitals:   07/23/17 0600 07/23/17 0615 07/23/17 0630 07/23/17 0645  BP: (!) 149/77 (!) 146/74 (!) 145/60 123/84  Pulse: 87 83 87 77  Resp:  20 18 17   Temp:      SpO2: 95% 96% 96% 97%     General:  Appears calm and Only slightly  uncomfortable Eyes:  PERRL, EOMI, normal lids, iris ENT:  grossly normal hearing, lips & tongue, his membranes of her mouth are pink slightly dry NG intact Neck:  no LAD, masses or thyromegaly Cardiovascular:  RRR, no m/r/g. No LE edema.  Respiratory:  CTA bilaterally, no w/r/r. Normal respiratory effort. Abdomen:  soft, nd, positive bowel sounds but sluggish, mild diffuse tenderness to palpation no guarding or rebounding Skin:  no rash or induration seen on limited exam Musculoskeletal:  grossly normal tone BUE/BLE, good ROM, no bony abnormality Psychiatric:  grossly normal mood and affect, speech fluent and appropriate, AOx3 Neurologic:  CN 2-12 grossly intact, moves all extremities in coordinated fashion, sensation intact  Labs on Admission: I have personally reviewed following labs and imaging studies  CBC:  Recent Labs Lab 07/22/17 2110  WBC 8.3  HGB 11.3*  HCT 36.3  MCV 86.8  PLT 983   Basic Metabolic Panel:  Recent Labs Lab 07/22/17 2110  NA 138  K 3.9  CL 106  CO2 25  GLUCOSE 238*  BUN 7  CREATININE 0.64  CALCIUM 8.8*   GFR: CrCl cannot be calculated (Unknown ideal weight.). Liver Function Tests:  Recent Labs Lab 07/22/17 2110  AST 17  ALT 9*  ALKPHOS 125  BILITOT 0.4  PROT 6.8  ALBUMIN 3.2*    Recent Labs Lab 07/22/17 2110  LIPASE 24   No results for input(s): AMMONIA in the last 168 hours. Coagulation Profile: No results for input(s): INR, PROTIME in the last 168 hours. Cardiac Enzymes: No results for input(s): CKTOTAL, CKMB, CKMBINDEX, TROPONINI in the last 168 hours. BNP (last 3 results) No results for input(s): PROBNP in the last 8760 hours. HbA1C: No results for input(s): HGBA1C in the last 72 hours. CBG: No results for input(s): GLUCAP in the last 168 hours. Lipid Profile: No results for input(s): CHOL, HDL, LDLCALC, TRIG, CHOLHDL, LDLDIRECT in the last 72 hours. Thyroid Function Tests: No results for input(s): TSH, T4TOTAL,  FREET4, T3FREE, THYROIDAB in the last 72 hours. Anemia Panel: No results for input(s): VITAMINB12, FOLATE, FERRITIN, TIBC, IRON, RETICCTPCT in the last 72 hours. Urine analysis:    Component Value Date/Time   COLORURINE YELLOW 07/22/2017 2113   APPEARANCEUR CLEAR 07/22/2017 2113   LABSPEC 1.023 07/22/2017 2113   PHURINE 5.0 07/22/2017 2113   GLUCOSEU NEGATIVE 07/22/2017 2113   HGBUR NEGATIVE 07/22/2017 2113   [place] NEGATIVE 07/22/2017 2113   [place] NEGATIVE 07/22/2017 2113   PROTEINUR 30 (A) 07/22/2017 2113   NITRITE NEGATIVE 07/22/2017 2113   LEUKOCYTESUR NEGATIVE 07/22/2017 2113    Creatinine Clearance: CrCl cannot be calculated (Unknown ideal weight.).  Sepsis Labs: @LABRCNTIP (procalcitonin:4,lacticidven:4) )No results found for this or any previous visit (from the past 240 hour(s)).   Radiological Exams on Admission: Ct Abdomen Pelvis Wo Contrast  Result Date: 07/23/2017 CLINICAL DATA:  61 y/o  F; upper abdominal  pain. EXAM: CT ABDOMEN AND PELVIS WITHOUT CONTRAST TECHNIQUE: Multidetector CT imaging of the abdomen and pelvis was performed following the standard protocol without IV contrast. COMPARISON:  None. FINDINGS: Lower chest: Small cluster of nodules in left lower lobe with bronchovascular distribution compatible with minimal bronchiolitis. Hepatobiliary: Hepatic steatosis. Punctate calcification in right lobe of liver. Normal gallbladder. No intra or extrahepatic biliary ductal dilatation. Pancreas: Unremarkable. No pancreatic ductal dilatation or surrounding inflammatory changes. Spleen: Normal in size without focal abnormality. Adrenals/Urinary Tract: Normal adrenal glands. 1 cm lucency within left kidney interpolar region with fluid attenuation compatible with cysts. No urinary stone disease. No hydronephrosis. Normal bladder. Stomach/Bowel: Partial colectomy. Left lower quadrant colostomy. 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 Mildly dilated loops of small bowel within the lower abdomen with fecalization and mild surrounding edema may represent small-bowel obstruction, no discrete transition identified. Vascular/Lymphatic: Aortic atherosclerosis. No enlarged abdominal or pelvic lymph nodes. Reproductive: Status post hysterectomy. No adnexal masses. Other: Negative. Musculoskeletal: No acute osseous abnormality. Mild degenerative changes of the spine greatest at the L5-S1 level where there is disc space narrowing. IMPRESSION: 1. Mildly dilated loops of small bowel in the lower abdomen with fecalization and mild surrounding edema may represent small bowel obstruction, no discrete transition identified. 2. 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 3. Hepatic steatosis. 4. Minimal left lower lobe bronchiolitis. Electronically Signed   By: Kristine Garbe M.D.   On: 07/23/2017 05:15   Dg Abd Portable 1 View  Result Date: 07/23/2017 CLINICAL DATA:  61 y/o  F; enteric tube placement. EXAM: PORTABLE ABDOMEN - 1 VIEW COMPARISON:  07/23/2017 CT abdomen and pelvis. FINDINGS: Enteric tube tip projects over the gastric body. Normal partially visualized bowel gas pattern. No acute osseous abnormality identified. IMPRESSION: Enteric tube tip projects over gastric body. Electronically Signed   By: Kristine Garbe M.D.   On: 07/23/2017 06:57    EKG: pending  Assessment/Plan Principal Problem:   SBO (small bowel obstruction) ([place]) Active Problems:   Diabetes mellitus without complication ([facility])   Cancer ([place])   Colostomy care ([place])   Bronchiolitis   Hepatic steatosis   #1. Abdominal pain/small bowel obstruction. CT abdomen concerning for small bowel obstruction. Lipase within the limits of normal. LFT's unremarkable.  She is afebrile hemodynamically stable and not hypoxic. NG tube inserted in ordered  to low intermittent suction. -Admit to MedSurg -Continue NG tube -Bowel rest -Supportive therapy -We'll await general surgery recommendations -We'll obtain a EKG in case she needs surgery  #2. Diabetes. Serum glucose 238. Home medications include Lantus -We will hold oral agents -Continue Lantus at home dose -Sliding scale for optimal control every 4 hours -Obtain hemoglobin A1c  #3. History of colon cancer. Status post partial colectomy 2016 with colostomy. Status post radiation and chemotherapy last treatment 2 months ago. She has chronic pain. Attends a pain clinic in [place]. -Pain management -Colostomy care  #4. Mild bronchiolitis. Per CT as noted above. Oxygen saturation level greater than 90% on room air. Home medications include an inhaler. -Monitor oxygen saturation -Provide oxygen supplementation as indicated -Incentive spirometry -Mobilize  #5. Hepatic steatosis. Per CT. LFTs unremarkable. Stable    DVT prophylaxis: scd's Code Status: full  Family Communication: none present  Disposition Plan: home when ready  Consults called:  General surgery Admission status: inpatient   Radene Gunning MD [facility]  If 7PM-7AM,  please contact night-coverage [email or web address] Password [email or web address]  07/23/2017, 7:10 AM

## 2017-07-23 NOTE — ED Notes (Signed)
Pt is refusing to have CT scan done because she doesn't want to get the dye.

## 2017-07-24 DIAGNOSIS — K56609 Unspecified intestinal obstruction, unspecified as to partial versus complete obstruction: Secondary | ICD-10-CM

## 2017-07-24 DIAGNOSIS — K56600 Partial intestinal obstruction, unspecified as to cause: Secondary | ICD-10-CM | POA: Diagnosis present

## 2017-07-24 LAB — BASIC METABOLIC PANEL
Anion gap: 7 (ref 5–15)
BUN: 7 mg/dL (ref 6–20)
CALCIUM: 8.6 mg/dL — AB (ref 8.9–10.3)
CHLORIDE: 107 mmol/L (ref 101–111)
CO2: 27 mmol/L (ref 22–32)
CREATININE: 0.61 mg/dL (ref 0.44–1.00)
GFR calc Af Amer: >60 mL/min (ref >60)
GFR calc non Af Amer: >60 mL/min (ref >60)
GLUCOSE: 163 mg/dL — AB (ref 65–99)
Potassium: 3.7 mmol/L (ref 3.5–5.1)
Sodium: 141 mmol/L (ref 135–145)

## 2017-07-24 LAB — HEMOGLOBIN A1C
HEMOGLOBIN A1C: 7.7 % — AB (ref 4.8–5.6)
Mean Plasma Glucose: 174 mg/dL

## 2017-07-24 LAB — CBC
HCT: 34.1 % — ABNORMAL LOW (ref 36.0–46.0)
HEMOGLOBIN: 10.3 g/dL — AB (ref 12.0–15.0)
MCH: 26.3 pg (ref 26.0–34.0)
MCHC: 30.2 g/dL (ref 30.0–36.0)
MCV: 87 fL (ref 78.0–100.0)
PLATELETS: 272 10*3/uL (ref 150–400)
RBC: 3.92 MIL/uL (ref 3.87–5.11)
RDW: 16.2 % — ABNORMAL HIGH (ref 11.5–15.5)
WBC: 7.2 10*3/uL (ref 4.0–10.5)

## 2017-07-24 LAB — GLUCOSE, CAPILLARY
GLUCOSE-CAPILLARY: 158 mg/dL — AB (ref 65–99)
GLUCOSE-CAPILLARY: 204 mg/dL — AB (ref 65–99)
Glucose-Capillary: 153 mg/dL — ABNORMAL HIGH (ref 65–99)
Glucose-Capillary: 157 mg/dL — ABNORMAL HIGH (ref 65–99)

## 2017-07-24 LAB — PROTIME-INR
INR: 1.12
PROTHROMBIN TIME: 14.5 s (ref 11.4–15.2)

## 2017-07-24 MED ORDER — SODIUM CHLORIDE 0.9 % IV SOLN
INTRAVENOUS | Status: AC
  Administered 2017-07-24: 03:00:00 via INTRAVENOUS

## 2017-07-24 MED ORDER — PHENOL 1.4 % MT LIQD: 1.0000 | OROMUCOSAL | Status: DC | PRN

## 2017-07-24 MED ORDER — ORAL CARE MOUTH RINSE
15.0000 mL | Freq: Two times a day (BID) | OROMUCOSAL | Status: DC
  Administered 2017-07-24: 15 mL via OROMUCOSAL

## 2017-07-24 NOTE — Progress Notes (Signed)
Discharge home. Home discahrge instruction given, no question verbalized. 

## 2017-07-24 NOTE — Progress Notes (Signed)
    CC: abdominal pain   Subjective: NG is out and she wants food, no complaints of pain stool and gas in the ostomy.  Objective: Vital signs in last 24 hours: Temp:  [97.6 F (36.4 C)-98.6 F (37 C)] 98.5 F (36.9 C) (07/27 0440) Pulse Rate:  [88-92] 88 (07/27 0440) Resp:  [18-19] 18 (07/27 0440) BP: (108-144)/(65-76) 130/76 (07/27 0440) SpO2:  [94 %-96 %] 95 % (07/27 0440) Last BM Date: 07/23/17 NPO 1100 IV Urine 500 NG 450 BM this AM recorded Afebrile, VSS Labs OK Glucose is up Film:  Contrast material throughout the nondistended colon. No evidence of small bowel obstruction Intake/Output from previous day: 07/26 0701 - 07/27 0700 In: 1095.8 [I.V.:1095.8] Out: 1150 [Urine:500; Emesis/NG output:450] Intake/Output this shift: Total I/O In: 240 [P.O.:240] Out: 1 [Stool:1]  General appearance: alert, cooperative and no distress GI: soft, non-tender; bowel sounds normal; no masses,  no organomegaly and stool in the ostomy   Lab Results:   Recent Labs  07/22/17 2110 07/24/17 0510  WBC 8.3 7.2  HGB 11.3* 10.3*  HCT 36.3 34.1*  PLT 279 272    BMET  Recent Labs  07/22/17 2110 07/24/17 0510  NA 138 141  K 3.9 3.7  CL 106 107  CO2 25 27  GLUCOSE 238* 163*  BUN 7 7  CREATININE 0.64 0.61  CALCIUM 8.8* 8.6*   PT/INR  Recent Labs  07/23/17 0712 07/24/17 0510  LABPROT 13.9 14.5  INR 1.07 1.12     Recent Labs Lab 07/22/17 2110  AST 17  ALT 9*  ALKPHOS 125  BILITOT 0.4  PROT 6.8  ALBUMIN 3.2*     Lipase     Component Value Date/Time   LIPASE 24 07/22/2017 2110     Medications: . insulin aspart  0-15 Units Subcutaneous Q4H  . insulin glargine  10 Units Subcutaneous Daily  . mouth rinse  15 mL Mouth Rinse BID   . sodium chloride 50 mL/hr at 07/24/17 0600   Anti-infectives    None      Assessment/Plan Colon cancer with partial colectomy, radiation and chemotherapy this year. Partial small bowel obstruction. Chronic abdominal  pain on oxycodone - patient has been out of medications for 2 weeks. 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  She can go when she tolerates a soft diet.  CAll if we can assist.   LOS: 1 day    Skii Cleland 07/24/2017 [phone]

## 2017-07-24 NOTE — Discharge Summary (Signed)
Physician Discharge Summary  Katherine Gonzalez FGH:[id number] DOB: 03/04/1956 DOA: 07/23/2017  PCP: Patient, No Pcp Per  Admit date: 07/23/2017 Discharge date: 07/24/2017  Admitted From: home  Disposition:  home    Discharge Condition:  stable   CODE STATUS:  Full code   Consultations:  Gen surgery    Discharge Diagnoses:  Principal Problem:   SBO (small bowel obstruction) ([place]) Active Problems:   Diabetes mellitus without complication ([place])   Cancer ([place])   Colostomy care ([place])   Bronchiolitis   Hepatic steatosis   Essential hypertension    Subjective: Abdominal pain has resolved. Wants solid food ASAP and wants to go home.   Brief Summary: Katherine Gonzalez is a very pleasant 61 y.o. female with medical history significant for colon cancer s/p partial colectomy 2016, colostomy,  diabetes presents to the emergency Department chief complaint of persistent worsening abdominal pain.  CT of the abdomen is consistent with partial small bowel obstruction which she has had problems with in the past.   Hospital Course:  Partial SBO - recurrent episodes - NG tube placed- Gen surgery consulted - small bowel protocol with contrast completed- contrast noted to have reached the colon and therefore NG removed today. She is tolerating solid food, has stool in colostomy and is stable for d/c today.   No medication changes made.   Discharge Instructions  Discharge Instructions    Diet - low sodium heart healthy    Complete by:  As directed    Diet Carb Modified    Complete by:  As directed    Increase activity slowly    Complete by:  As directed      Allergies as of 07/24/2017   No Known Allergies     Medication List    TAKE these medications   albuterol 108 (90 Base) MCG/ACT inhaler Commonly known as:  PROVENTIL HFA;VENTOLIN HFA Inhale 1-2 puffs into the lungs every 6 (six) hours as needed for wheezing or shortness of breath.   aspirin EC 81 MG tablet Take 81 mg by mouth  daily.   gabapentin 300 MG capsule Commonly known as:  NEURONTIN Take 600 mg by mouth 3 (three) times daily.   insulin glargine 100 UNIT/ML injection Commonly known as:  LANTUS Inject 10 Units into the skin daily.   lisinopril 10 MG tablet Commonly known as:  PRINIVIL,ZESTRIL Take 10 mg by mouth daily.   metFORMIN 1000 MG tablet Commonly known as:  GLUCOPHAGE Take 1,000 mg by mouth daily with breakfast.   Vitamin D3 5000 units Tabs Take 5,000 Units by mouth daily.       No Known Allergies   Procedures/Studies: NG tube  Ct Abdomen Pelvis Wo Contrast  Result Date: 07/23/2017 CLINICAL DATA:  61 y/o  F; upper abdominal pain. EXAM: CT ABDOMEN AND PELVIS WITHOUT CONTRAST TECHNIQUE: Multidetector CT imaging of the abdomen and pelvis was performed following the standard protocol without IV contrast. COMPARISON:  None. FINDINGS: Lower chest: Small cluster of nodules in left lower lobe with bronchovascular distribution compatible with minimal bronchiolitis. Hepatobiliary: Hepatic steatosis. Punctate calcification in right lobe of liver. Normal gallbladder. No intra or extrahepatic biliary ductal dilatation. Pancreas: Unremarkable. No pancreatic ductal dilatation or surrounding inflammatory changes. Spleen: Normal in size without focal abnormality. Adrenals/Urinary Tract: Normal adrenal glands. 1 cm lucency within left kidney interpolar region with fluid attenuation compatible with cysts. No urinary stone disease. No hydronephrosis. Normal bladder. Stomach/Bowel: Partial colectomy. Left lower quadrant colostomy. Parastomal hernia containing fat and a loop  of small bowel without proximal obstruction. Mildly dilated loops of small bowel within the lower abdomen with fecalization and mild surrounding edema may represent small-bowel obstruction, no discrete transition identified. Vascular/Lymphatic: Aortic atherosclerosis. No enlarged abdominal or pelvic lymph nodes. Reproductive: Status post  hysterectomy. No adnexal masses. Other: Negative. Musculoskeletal: No acute osseous abnormality. Mild degenerative changes of the spine greatest at the L5-S1 level where there is disc space narrowing. IMPRESSION: 1. Mildly dilated loops of small bowel in the lower abdomen with fecalization and mild surrounding edema may represent small bowel obstruction, no discrete transition identified. 2. 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 3. Hepatic steatosis. 4. Minimal left lower lobe bronchiolitis. Electronically Signed   By: Kristine Garbe M.D.   On: 07/23/2017 05:15   Dg Abd 1 View  Result Date: 07/24/2017 CLINICAL DATA:  Small-bowel protocol.  12 hour delay. EXAM: ABDOMEN - 1 VIEW COMPARISON:  07/23/2017 FINDINGS: Contrast material again demonstrated throughout the colon with no significant residual in the small bowel. No evidence of small bowel obstruction. Enteric tube tip is in the left upper quadrant consistent with location in the body of the stomach. IMPRESSION: Contrast material throughout the nondistended colon. No evidence of small bowel obstruction. Electronically Signed   By: Lucienne Capers M.D.   On: 07/24/2017 01:36   Dg Abd Portable 1v-small Bowel Obstruction Protocol-initial, 8 Hr Delay  Result Date: 07/23/2017 CLINICAL DATA:  Small bowel obstruction. EXAM: PORTABLE ABDOMEN - 1 VIEW COMPARISON:  07/23/2017 FINDINGS: The bowel gas pattern is normal. Oral contrast throughout the colon. No suspicious calcifications or organomegaly. Enteric catheter overlies expected location of gastric body. The stomach is decompressed. IMPRESSION: Nonobstructive bowel gas pattern. Electronically Signed   By: Fidela Salisbury M.D.   On: 07/23/2017 19:39   Dg Abd Portable 1 View  Result Date: 07/23/2017 CLINICAL DATA:  61 y/o  F; enteric tube placement. EXAM: PORTABLE ABDOMEN - 1 VIEW  COMPARISON:  07/23/2017 CT abdomen and pelvis. FINDINGS: Enteric tube tip projects over the gastric body. Normal partially visualized bowel gas pattern. No acute osseous abnormality identified. IMPRESSION: Enteric tube tip projects over gastric body. Electronically Signed   By: Kristine Garbe M.D.   On: 07/23/2017 06:57       Discharge Exam: Vitals:   07/24/17 0440 07/24/17 1409  BP: 130/76 123/81  Pulse: 88 90  Resp: 18 18  Temp: 98.5 F (36.9 C) 98.7 F (37.1 C)   Vitals:   07/23/17 1500 07/23/17 2100 07/24/17 0440 07/24/17 1409  BP: 108/65 (!) 144/69 130/76 123/81  Pulse: 88 92 88 90  Resp: 19 19 18 18   Temp: 97.6 F (36.4 C) 98.6 F (37 C) 98.5 F (36.9 C) 98.7 F (37.1 C)  TempSrc: Oral Oral Oral Oral  SpO2: 94% 96% 95%   Weight:      Height:        General: Pt is alert, awake, not in acute distress Cardiovascular: RRR, S1/S2 +, no rubs, no gallops Respiratory: CTA bilaterally, no wheezing, no rhonchi Abdominal: Soft, NT, ND, bowel sounds + Extremities: no edema, no cyanosis    The results of significant diagnostics from this hospitalization (including imaging, microbiology, ancillary and laboratory) are listed below for reference.     Microbiology: No results found for this or any previous visit (from the past 240 hour(s)).   Labs: BNP (last 3 results) No results for input(s): BNP in the last  8760 hours. Basic Metabolic Panel:  Recent Labs Lab 07/22/17 2110 07/24/17 0510  NA 138 141  K 3.9 3.7  CL 106 107  CO2 25 27  GLUCOSE 238* 163*  BUN 7 7  CREATININE 0.64 0.61  CALCIUM 8.8* 8.6*   Liver Function Tests:  Recent Labs Lab 07/22/17 2110  AST 17  ALT 9*  ALKPHOS 125  BILITOT 0.4  PROT 6.8  ALBUMIN 3.2*    Recent Labs Lab 07/22/17 2110  LIPASE 24   No results for input(s): AMMONIA in the last 168 hours. CBC:  Recent Labs Lab 07/22/17 2110 07/24/17 0510  WBC 8.3 7.2  HGB 11.3* 10.3*  HCT 36.3 34.1*  MCV 86.8 87.0   PLT 279 272   Cardiac Enzymes: No results for input(s): CKTOTAL, CKMB, CKMBINDEX, TROPONINI in the last 168 hours. BNP: Invalid input(s): POCBNP CBG:  Recent Labs Lab 07/23/17 2006 07/23/17 2348 07/24/17 0446 07/24/17 0757 07/24/17 1200  GLUCAP 115* 137* 158* 153* 204*   D-Dimer No results for input(s): DDIMER in the last 72 hours. Hgb A1c  Recent Labs  07/23/17 0644  HGBA1C 7.7*   Lipid Profile No results for input(s): CHOL, HDL, LDLCALC, TRIG, CHOLHDL, LDLDIRECT in the last 72 hours. Thyroid function studies No results for input(s): TSH, T4TOTAL, T3FREE, THYROIDAB in the last 72 hours.  Invalid input(s): FREET3 Anemia work up No results for input(s): VITAMINB12, FOLATE, FERRITIN, TIBC, IRON, RETICCTPCT in the last 72 hours. Urinalysis    Component Value Date/Time   COLORURINE YELLOW 07/22/2017 2113   APPEARANCEUR CLEAR 07/22/2017 2113   LABSPEC 1.023 07/22/2017 2113   PHURINE 5.0 07/22/2017 2113   GLUCOSEU NEGATIVE 07/22/2017 2113   HGBUR NEGATIVE 07/22/2017 2113   [place] NEGATIVE 07/22/2017 2113   [place] NEGATIVE 07/22/2017 2113   PROTEINUR 30 (A) 07/22/2017 2113   NITRITE NEGATIVE 07/22/2017 2113   LEUKOCYTESUR NEGATIVE 07/22/2017 2113   Sepsis Labs Invalid input(s): PROCALCITONIN,  WBC,  LACTICIDVEN Microbiology No results found for this or any previous visit (from the past 240 hour(s)).   Time coordinating discharge: Over 30 minutes  SIGNED:   Debbe Odea, MD  [facility] 07/24/2017, 3:09 PM Pager   If 7PM-7AM, please contact night-coverage [email or web address] Password [email or web address]

## 2017-07-27 ENCOUNTER — Encounter (HOSPITAL_COMMUNITY): Payer: Self-pay | Admitting: Emergency Medicine

## 2018-06-21 IMAGING — CT CT ABD-PELV W/O CM
2 of 4 series · 16 of 46 positions shown, 18 images · non-contrast
Comparison: None.

CLINICAL DATA: 60 y/o  F; upper abdominal pain.

EXAM:
CT ABDOMEN AND PELVIS WITHOUT CONTRAST
TECHNIQUE: Multidetector CT imaging of the abdomen and pelvis was performed
following the standard protocol without IV contrast.

[Series 3: ap without · axial · non-contrast · 0.86mm/px · z∈[-481,-26]mm · 13 of 101 slices shown, 15 images]
[im 5/101  soft-tissue]
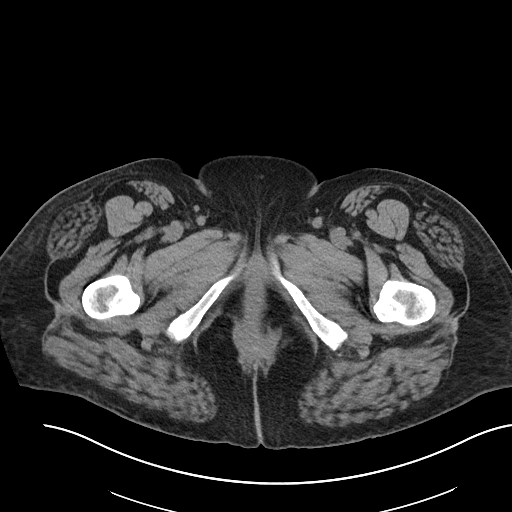
[im 5/101  bone]
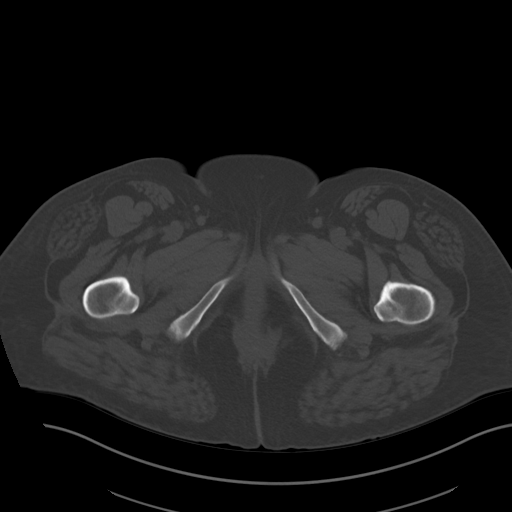
[im 15/101  soft-tissue]
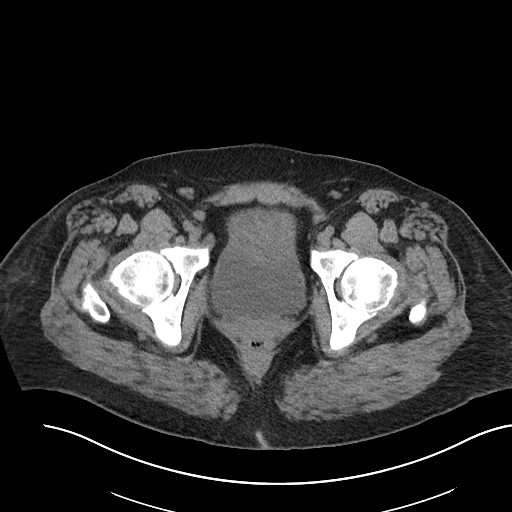
[im 20/101  soft-tissue]
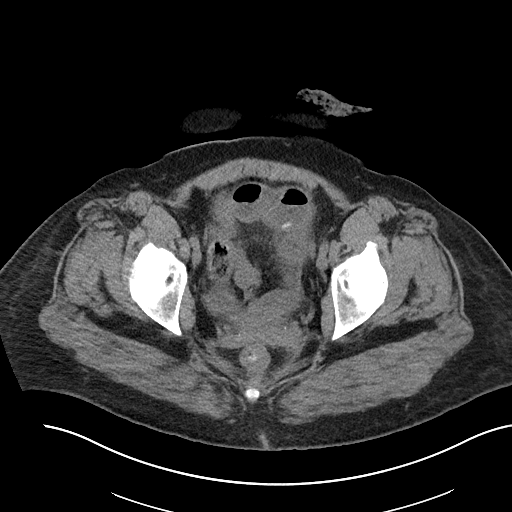
[im 29/101  soft-tissue]
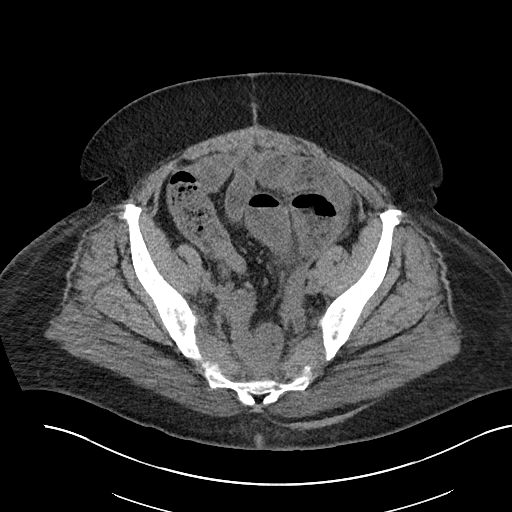
[im 34/101  soft-tissue]
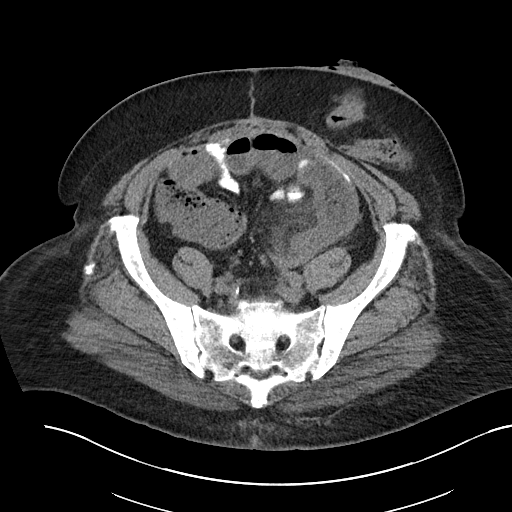
[im 43/101  soft-tissue]
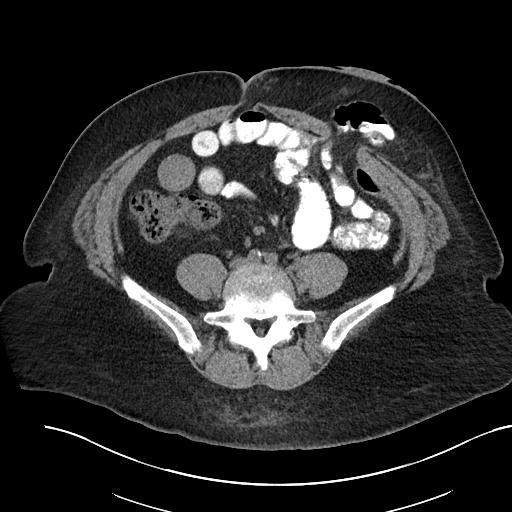
[im 53/101  soft-tissue]
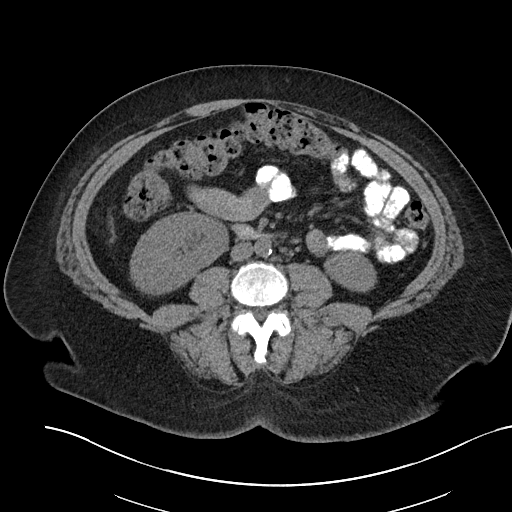
[im 58/101  soft-tissue]
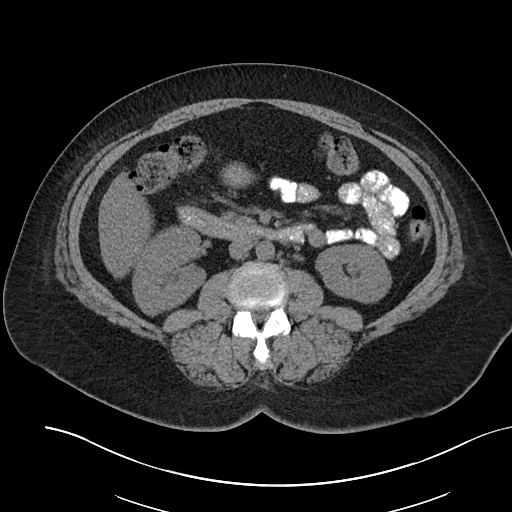
[im 67/101  soft-tissue]
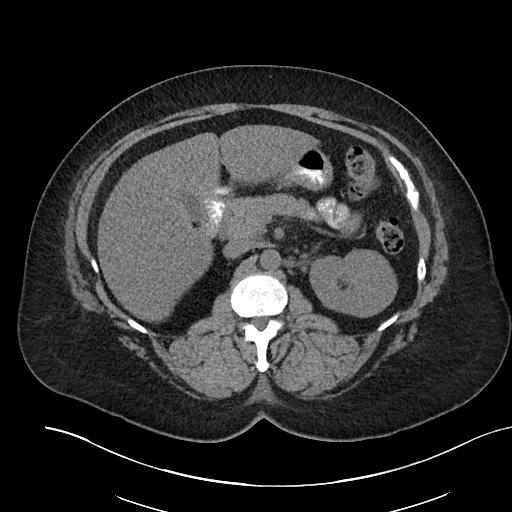
[im 67/101  bone]
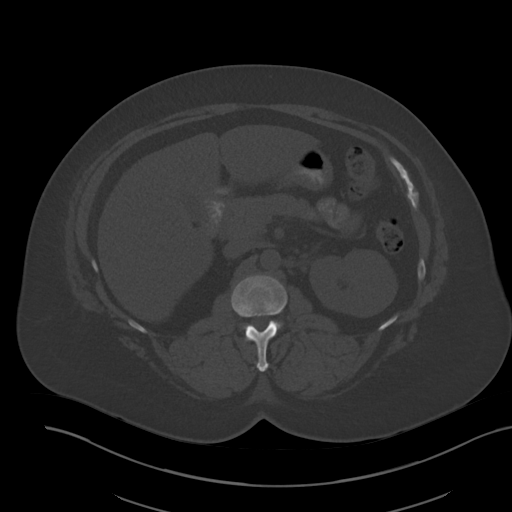
[im 72/101  soft-tissue]
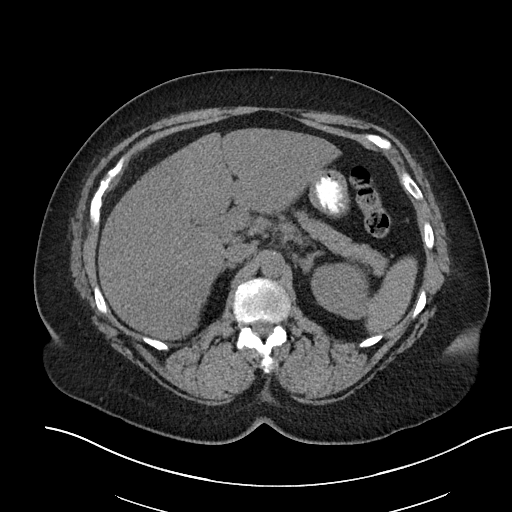
[im 81/101  soft-tissue]
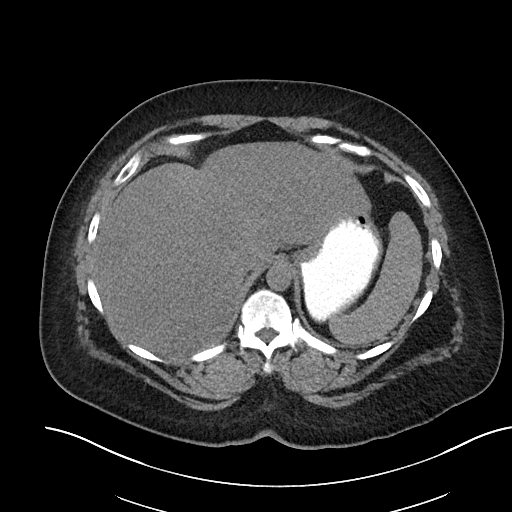
[im 86/101  soft-tissue]
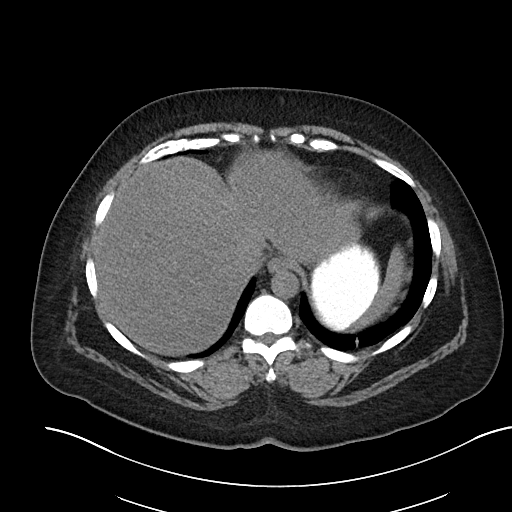
[im 96/101  soft-tissue]
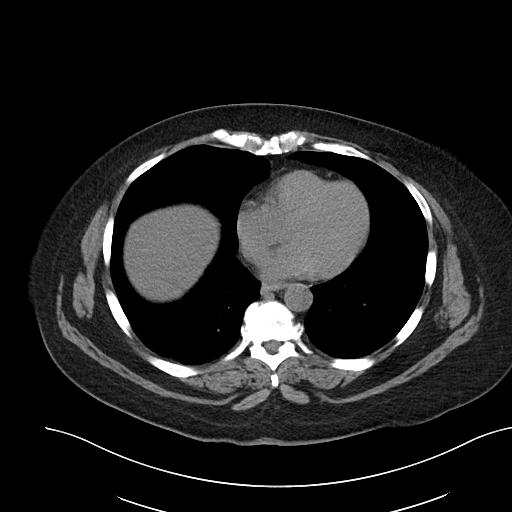

[Series 6: cor · coronal · 0.90mm/px · 3 of 104 slices shown]
[im 35/104  soft-tissue]
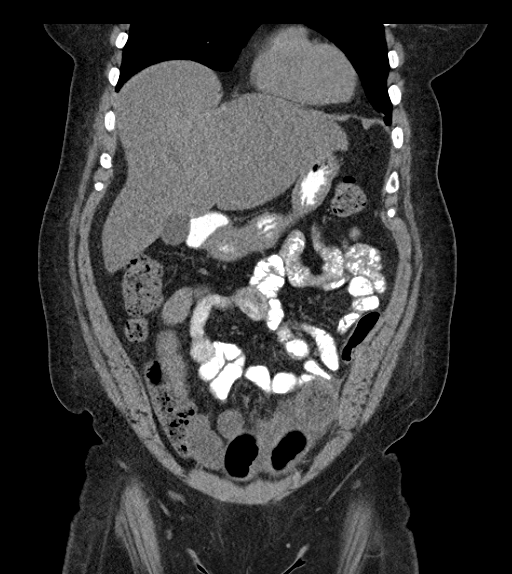
[im 46/104  soft-tissue]
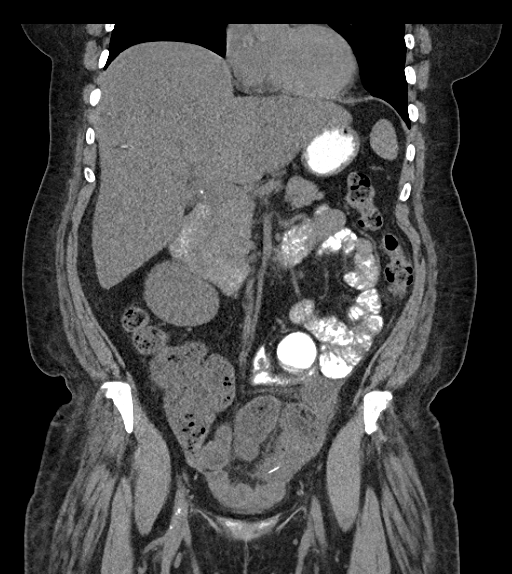
[im 58/104  soft-tissue]
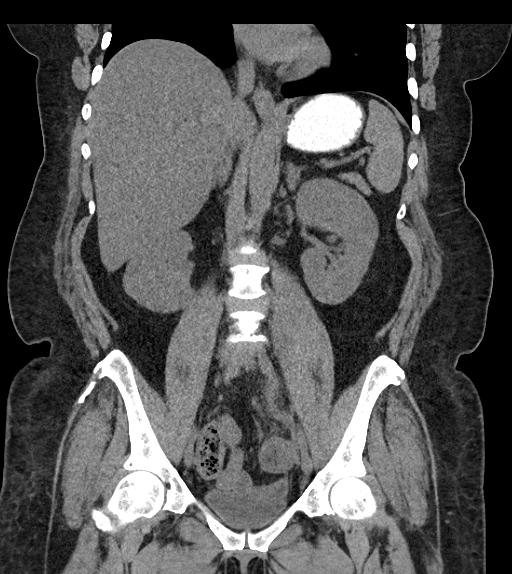

[16 of 46 positions shown; findings below may reference images not displayed]

FINDINGS: Lower chest: Small cluster of nodules in left lower lobe with
bronchovascular distribution compatible with minimal bronchiolitis.

Hepatobiliary: Hepatic steatosis. Punctate calcification in right
lobe of liver. Normal gallbladder. No intra or extrahepatic biliary
ductal dilatation.

Pancreas: Unremarkable. No pancreatic ductal dilatation or
surrounding inflammatory changes.

Spleen: Normal in size without focal abnormality.

Adrenals/Urinary Tract: Normal adrenal glands. 1 cm lucency within
left kidney interpolar region with fluid attenuation compatible with
cysts. No urinary stone disease. No hydronephrosis. Normal bladder.

Stomach/Bowel: Partial colectomy. Left lower quadrant colostomy.
Parastomal hernia containing fat and a loop of small bowel without
proximal obstruction. Mildly dilated loops of small bowel within the
lower abdomen with fecalization and mild surrounding edema may
represent small-bowel obstruction, no discrete transition
identified.

Vascular/Lymphatic: Aortic atherosclerosis. No enlarged abdominal or
pelvic lymph nodes.

Reproductive: Status post hysterectomy. No adnexal masses.

Other: Negative.

Musculoskeletal: No acute osseous abnormality. Mild degenerative
changes of the spine greatest at the L5-S1 level where there is disc
space narrowing.
IMPRESSION: 1. Mildly dilated loops of small bowel in the lower abdomen with
fecalization and mild surrounding edema may represent small bowel
obstruction, no discrete transition identified.
2. Left lower quadrant colostomy with parastomal hernia containing a
loop of small bowel without immediate proximal obstruction and
passage of contrast.
3. Hepatic steatosis.
4. Minimal left lower lobe bronchiolitis.

By: Bondarciuc Cumatrenco M.D.

## 2018-06-21 IMAGING — DX DG ABD PORTABLE 1V
1 series · 1 of 1 positions shown · non-contrast
Comparison: 07/23/2017 CT abdomen and pelvis.

CLINICAL DATA: 60 y/o  F; enteric tube placement.

EXAM:
PORTABLE ABDOMEN - 1 VIEW

[abdomen kub]
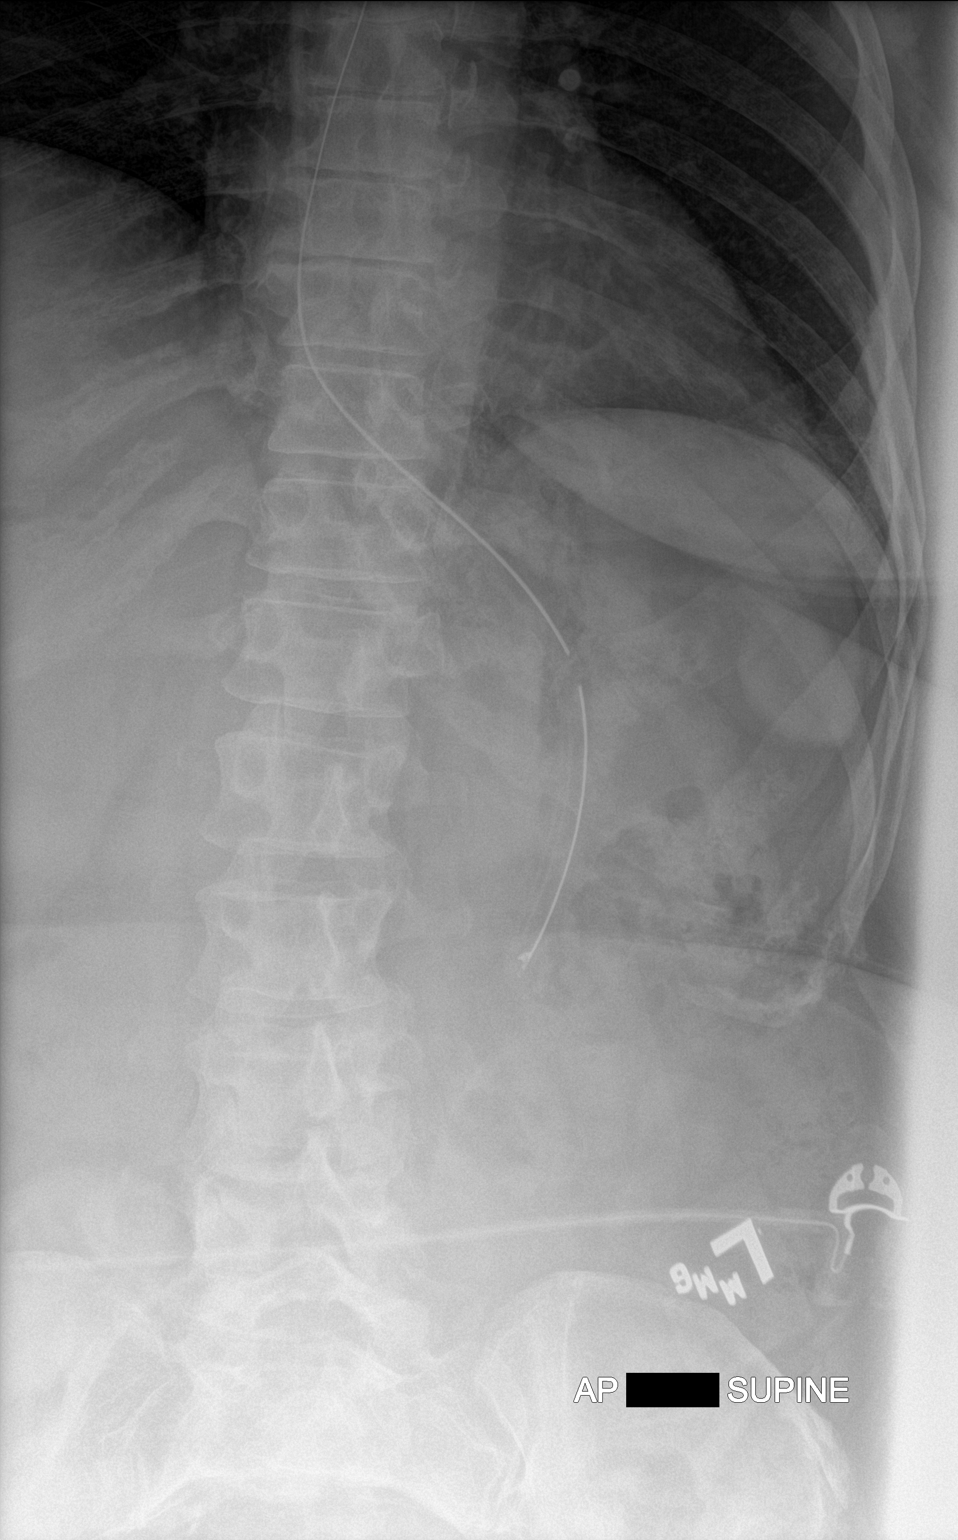

[1 of 1 positions shown; findings below may reference images not displayed]

FINDINGS: Enteric tube tip projects over the gastric body. Normal partially
visualized bowel gas pattern. No acute osseous abnormality
identified.
IMPRESSION: Enteric tube tip projects over gastric body.

By: Jukka-Ville Ali Raza M.D.

## 2019-03-30 DEATH — deceased
